# Patient Record
Sex: Male | Born: 1935 | Race: White | Hispanic: No | Marital: Married | Smoking: Never smoker
Health system: Southern US, Community
[De-identification: ages and names within clinical notes are randomized; demographics above are authoritative.]

## PROBLEM LIST (undated history)

## (undated) DIAGNOSIS — G039 Meningitis, unspecified: Secondary | ICD-10-CM

## (undated) DIAGNOSIS — N189 Chronic kidney disease, unspecified: Secondary | ICD-10-CM

## (undated) DIAGNOSIS — E210 Primary hyperparathyroidism: Secondary | ICD-10-CM

## (undated) DIAGNOSIS — E119 Type 2 diabetes mellitus without complications: Secondary | ICD-10-CM

## (undated) DIAGNOSIS — K861 Other chronic pancreatitis: Secondary | ICD-10-CM

## (undated) DIAGNOSIS — I10 Essential (primary) hypertension: Secondary | ICD-10-CM

## (undated) DIAGNOSIS — F0280 Dementia in other diseases classified elsewhere without behavioral disturbance: Secondary | ICD-10-CM

## (undated) DIAGNOSIS — I619 Nontraumatic intracerebral hemorrhage, unspecified: Secondary | ICD-10-CM

## (undated) DIAGNOSIS — N200 Calculus of kidney: Secondary | ICD-10-CM

## (undated) DIAGNOSIS — B351 Tinea unguium: Secondary | ICD-10-CM

## (undated) DIAGNOSIS — E781 Pure hyperglyceridemia: Secondary | ICD-10-CM

## (undated) DIAGNOSIS — I358 Other nonrheumatic aortic valve disorders: Secondary | ICD-10-CM

## (undated) DIAGNOSIS — M1990 Unspecified osteoarthritis, unspecified site: Secondary | ICD-10-CM

## (undated) DIAGNOSIS — D649 Anemia, unspecified: Secondary | ICD-10-CM

## (undated) HISTORY — DX: Calculus of kidney: N20.0

## (undated) HISTORY — DX: Primary hyperparathyroidism: E21.0

## (undated) HISTORY — DX: Tinea unguium: B35.1

## (undated) HISTORY — PX: CARDIAC CATHETERIZATION: SHX172

## (undated) HISTORY — DX: Dementia in other diseases classified elsewhere, unspecified severity, without behavioral disturbance, psychotic disturbance, mood disturbance, and anxiety: F02.80

## (undated) HISTORY — DX: Pure hyperglyceridemia: E78.1

## (undated) HISTORY — DX: Essential (primary) hypertension: I10

## (undated) HISTORY — PX: BRAIN SURGERY: SHX531

## (undated) HISTORY — DX: Anemia, unspecified: D64.9

## (undated) HISTORY — DX: Meningitis, unspecified: G03.9

## (undated) HISTORY — DX: Nontraumatic intracerebral hemorrhage, unspecified: I61.9

## (undated) HISTORY — DX: Other nonrheumatic aortic valve disorders: I35.8

## (undated) HISTORY — DX: Chronic kidney disease, unspecified: N18.9

## (undated) HISTORY — PX: TONSILLECTOMY: SUR1361

---

## 2015-10-17 ENCOUNTER — Ambulatory Visit
Admission: EM | Admit: 2015-10-17 | Discharge: 2015-10-17 | Disposition: A | Discharge status: Home / Self Care | Payer: Medicare Other | Attending: Family Medicine | Admitting: Family Medicine

## 2015-10-17 ENCOUNTER — Encounter: Payer: Self-pay | Admitting: Emergency Medicine

## 2015-10-17 ENCOUNTER — Ambulatory Visit (INDEPENDENT_AMBULATORY_CARE_PROVIDER_SITE_OTHER): Payer: Medicare Other

## 2015-10-17 DIAGNOSIS — S7002XA Contusion of left hip, initial encounter: Secondary | ICD-10-CM

## 2015-10-17 DIAGNOSIS — M25552 Pain in left hip: Secondary | ICD-10-CM

## 2015-10-17 HISTORY — DX: Unspecified osteoarthritis, unspecified site: M19.90

## 2015-10-17 HISTORY — DX: Type 2 diabetes mellitus without complications: E11.9

## 2015-10-17 HISTORY — DX: Other chronic pancreatitis: K86.1

## 2015-10-17 NOTE — ED Provider Notes (Signed)
CSN: [id number]     Arrival date & time 10/17/15  1334 History   First MD Initiated Contact with Patient 10/17/15 1532     Chief Complaint  Patient presents with  . Leg Pain   (Consider location/radiation/quality/duration/timing/severity/associated sxs/prior Treatment) HPI Comments: 82 yo male fell 3 days ago at home and landed on concrete hitting his left hip. Has been having pain since then. Able to walk but painful when walking. Denies pain radiating, numbness/tingling, bowel/bladder problems. Denies hitting his head or loss of consciousness.   The history is provided by the patient.    Past Medical History  Diagnosis Date  . Diabetes mellitus without complication ([place])   . 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 Arthritis    Past Surgical History  Procedure Laterality Date  . Tonsillectomy     History reviewed. No pertinent family history. Social History  Substance Use Topics  . Smoking status: Never Smoker   . Smokeless tobacco: None  . Alcohol Use: No    Review of Systems  Allergies  Review of patient's allergies indicates no known allergies.  Home Medications   Prior to Admission medications   Medication Sig Start Date End Date Taking? Authorizing Provider  AMLODIPINE BESY-BENAZEPRIL HCL PO Take by mouth.   Yes Historical Provider, MD  GLIPIZIDE PO Take by mouth.   Yes Historical Provider, MD  insulin glargine (LANTUS) 100 UNIT/ML injection Inject into the skin at bedtime.   Yes Historical Provider, MD  insulin lispro (HUMALOG) 100 UNIT/ML injection Inject into the skin 3 (three) times daily before meals.   Yes Historical Provider, MD  tamsulosin (FLOMAX) 0.4 MG CAPS capsule Take 0.4 mg by mouth.   Yes Historical Provider, MD   Meds Ordered and Administered this Visit  Medications - No data to display  BP 155/67 mmHg  Pulse 57  Temp(Src) 97.5 F (36.4 C) (Oral)  Resp 18  Ht 5\' 4"  (1.626 m)  Wt 138 lb (62.596 kg)  BMI 23.68 kg/m2  SpO2 99% No data  found.   Physical Exam  Constitutional: He appears well-developed and well-nourished. No distress.  HENT:  Head: Normocephalic and atraumatic.  Musculoskeletal:       Left hip: He exhibits tenderness and bony tenderness. He exhibits normal range of motion, normal strength, no swelling, no crepitus, no deformity and no laceration.  Neurological: He is alert.  Skin: Skin is warm and dry. No rash noted. He is not diaphoretic.  Nursing note and vitals reviewed.   ED Course  Procedures (including critical care time)  Labs Review Labs Reviewed - No data to display  Imaging Review Dg Hip Unilat With Pelvis 2-3 Views Left  10/17/2015  CLINICAL DATA:  Left hip pain since fall 4 days ago. EXAM: DG HIP (WITH OR WITHOUT PELVIS) 2-3V LEFT COMPARISON:  None. FINDINGS: Osseous alignment is normal. Left femoral head well positioned relative to the acetabulum. No fracture line or displaced fracture fragment seen. Mild degenerative change noted within the slightly scoliotic lower lumbar spine. Heavy atherosclerotic calcifications seen within the soft tissues about the bilateral hips and within the upper thighs. Soft tissues about the pelvis and left hip are otherwise unremarkable. IMPRESSION: No acute findings.  No osseous fracture or dislocation seen. Electronically Signed   By: Franki Cabot M.D.   On: 10/17/2015 14:56     Visual Acuity Review  Right Eye Distance:   Left Eye Distance:   Bilateral Distance:    Right Eye Near:   Left  Eye Near:    Bilateral Near:         MDM   1. Contusion of left hip, initial encounter   2. Hip pain, left    1. x-ray result (negative left hip/pelvis) and diagnosis reviewed with patient and spouse 2. rx as per orders above; reviewed possible side effects, interactions, risks and benefits  3. Recommend supportive treatment with rest, gentle stretching, ice/heat; use of walker for support 4. Follow-up prn if symptoms worsen or don't improve    Norval Gable, MD 10/17/15 1657

## 2015-10-17 NOTE — ED Notes (Signed)
Pt reports fell on Friday and since then has had worsening left side pain described now as in left buttocks/left lower back per spouse pain has been in left hip and leg as well. Pt reports "pain has moved as odd as that sounds"

## 2016-01-16 ENCOUNTER — Other Ambulatory Visit: Payer: Self-pay | Admitting: Ophthalmology

## 2016-01-16 DIAGNOSIS — H5340 Unspecified visual field defects: Secondary | ICD-10-CM

## 2016-02-02 ENCOUNTER — Ambulatory Visit
Admission: RE | Admit: 2016-02-02 | Discharge: 2016-02-02 | Disposition: A | Discharge status: Home / Self Care | Payer: Medicare Other | Source: Ambulatory Visit | Attending: Ophthalmology | Admitting: Ophthalmology

## 2016-02-02 DIAGNOSIS — G939 Disorder of brain, unspecified: Secondary | ICD-10-CM | POA: Diagnosis not present

## 2016-02-02 DIAGNOSIS — H5340 Unspecified visual field defects: Secondary | ICD-10-CM | POA: Diagnosis not present

## 2016-02-02 DIAGNOSIS — G936 Cerebral edema: Secondary | ICD-10-CM | POA: Insufficient documentation

## 2016-02-02 DIAGNOSIS — K859 Acute pancreatitis without necrosis or infection, unspecified: Secondary | ICD-10-CM | POA: Insufficient documentation

## 2016-02-02 MED ORDER — GADOBENATE DIMEGLUMINE 529 MG/ML IV SOLN
15.0000 mL | Freq: Once | INTRAVENOUS | Status: AC | PRN
  Administered 2016-02-02: 13 mL via INTRAVENOUS

## 2017-12-23 ENCOUNTER — Ambulatory Visit: Payer: Medicare Other | Admitting: Podiatry

## 2020-04-28 ENCOUNTER — Other Ambulatory Visit: Payer: Self-pay | Admitting: Nephrology

## 2020-04-28 DIAGNOSIS — N184 Chronic kidney disease, stage 4 (severe): Secondary | ICD-10-CM

## 2020-04-28 DIAGNOSIS — E1122 Type 2 diabetes mellitus with diabetic chronic kidney disease: Secondary | ICD-10-CM

## 2020-05-04 ENCOUNTER — Ambulatory Visit: Payer: Medicare Other

## 2020-05-27 ENCOUNTER — Ambulatory Visit
Admission: RE | Admit: 2020-05-27 | Discharge: 2020-05-27 | Disposition: A | Discharge status: Home / Self Care | Payer: Medicare Other | Source: Ambulatory Visit | Attending: Nephrology | Admitting: Nephrology

## 2020-05-27 ENCOUNTER — Other Ambulatory Visit: Payer: Self-pay

## 2020-05-27 DIAGNOSIS — E1122 Type 2 diabetes mellitus with diabetic chronic kidney disease: Secondary | ICD-10-CM | POA: Diagnosis present

## 2020-05-27 DIAGNOSIS — N184 Chronic kidney disease, stage 4 (severe): Secondary | ICD-10-CM | POA: Diagnosis present

## 2020-06-23 ENCOUNTER — Other Ambulatory Visit: Payer: Self-pay

## 2020-06-23 ENCOUNTER — Inpatient Hospital Stay: Payer: Medicare Other | Attending: Oncology | Admitting: Oncology

## 2020-06-23 ENCOUNTER — Encounter: Payer: Self-pay | Admitting: Oncology

## 2020-06-23 ENCOUNTER — Inpatient Hospital Stay: Payer: Medicare Other

## 2020-06-23 DIAGNOSIS — D631 Anemia in chronic kidney disease: Secondary | ICD-10-CM | POA: Diagnosis not present

## 2020-06-23 DIAGNOSIS — E213 Hyperparathyroidism, unspecified: Secondary | ICD-10-CM | POA: Diagnosis not present

## 2020-06-23 DIAGNOSIS — R944 Abnormal results of kidney function studies: Secondary | ICD-10-CM | POA: Diagnosis not present

## 2020-06-23 DIAGNOSIS — N184 Chronic kidney disease, stage 4 (severe): Secondary | ICD-10-CM | POA: Insufficient documentation

## 2020-06-23 DIAGNOSIS — M1990 Unspecified osteoarthritis, unspecified site: Secondary | ICD-10-CM

## 2020-06-23 DIAGNOSIS — R5383 Other fatigue: Secondary | ICD-10-CM | POA: Diagnosis not present

## 2020-06-23 DIAGNOSIS — I129 Hypertensive chronic kidney disease with stage 1 through stage 4 chronic kidney disease, or unspecified chronic kidney disease: Secondary | ICD-10-CM | POA: Insufficient documentation

## 2020-06-23 DIAGNOSIS — Z79899 Other long term (current) drug therapy: Secondary | ICD-10-CM | POA: Insufficient documentation

## 2020-06-23 DIAGNOSIS — N189 Chronic kidney disease, unspecified: Secondary | ICD-10-CM

## 2020-06-23 DIAGNOSIS — E1122 Type 2 diabetes mellitus with diabetic chronic kidney disease: Secondary | ICD-10-CM

## 2020-06-23 LAB — COMPREHENSIVE METABOLIC PANEL
ALT: 20 U/L (ref 0–44)
AST: 24 U/L (ref 15–41)
Albumin: 3 g/dL — ABNORMAL LOW (ref 3.5–5.0)
Alkaline Phosphatase: 79 U/L (ref 38–126)
Anion gap: 6 (ref 5–15)
BUN: 44 mg/dL — ABNORMAL HIGH (ref 8–23)
CO2: 22 mmol/L (ref 22–32)
Calcium: 7.9 mg/dL — ABNORMAL LOW (ref 8.9–10.3)
Chloride: 108 mmol/L (ref 98–111)
Creatinine, Ser: 2.17 mg/dL — ABNORMAL HIGH (ref 0.61–1.24)
GFR calc non Af Amer: 26 mL/min — ABNORMAL LOW (ref >60)
Glucose, Bld: 295 mg/dL — ABNORMAL HIGH (ref 70–99)
Potassium: 4.6 mmol/L (ref 3.5–5.1)
Sodium: 136 mmol/L (ref 135–145)
Total Bilirubin: 0.6 mg/dL (ref 0.3–1.2)
Total Protein: 7.7 g/dL (ref 6.5–8.1)

## 2020-06-23 LAB — CBC WITH DIFFERENTIAL/PLATELET
Abs Immature Granulocytes: 0.04 10*3/uL (ref 0.00–0.07)
Basophils Absolute: 0.1 10*3/uL (ref 0.0–0.1)
Basophils Relative: 1 %
Eosinophils Absolute: 0.3 10*3/uL (ref 0.0–0.5)
Eosinophils Relative: 4 %
HCT: 28.8 % — ABNORMAL LOW (ref 39.0–52.0)
Hemoglobin: 9.5 g/dL — ABNORMAL LOW (ref 13.0–17.0)
Immature Granulocytes: 1 %
Lymphocytes Relative: 18 %
Lymphs Abs: 1.4 10*3/uL (ref 0.7–4.0)
MCH: 30.3 pg (ref 26.0–34.0)
MCHC: 33 g/dL (ref 30.0–36.0)
MCV: 91.7 fL (ref 80.0–100.0)
Monocytes Absolute: 0.6 10*3/uL (ref 0.1–1.0)
Monocytes Relative: 7 %
Neutro Abs: 5.6 10*3/uL (ref 1.7–7.7)
Neutrophils Relative %: 69 %
Platelets: 209 10*3/uL (ref 150–400)
RBC: 3.14 MIL/uL — ABNORMAL LOW (ref 4.22–5.81)
RDW: 13.4 % (ref 11.5–15.5)
WBC: 8 10*3/uL (ref 4.0–10.5)
nRBC: 0 % (ref 0.0–0.2)

## 2020-06-23 LAB — IRON AND TIBC
Iron: 48 ug/dL (ref 45–182)
Saturation Ratios: 22 % (ref 17.9–39.5)
TIBC: 221 ug/dL — ABNORMAL LOW (ref 250–450)
UIBC: 173 ug/dL

## 2020-06-23 LAB — RETICULOCYTES
Immature Retic Fract: 6.1 % (ref 2.3–15.9)
RBC.: 3.16 MIL/uL — ABNORMAL LOW (ref 4.22–5.81)
Retic Count, Absolute: 41.4 10*3/uL (ref 19.0–186.0)
Retic Ct Pct: 1.3 % (ref 0.4–3.1)

## 2020-06-23 LAB — FERRITIN: Ferritin: 286 ng/mL (ref 24–336)

## 2020-06-23 LAB — VITAMIN B12: Vitamin B-12: 442 pg/mL (ref 180–914)

## 2020-06-23 LAB — TSH: TSH: 1.924 u[IU]/mL (ref 0.350–4.500)

## 2020-06-23 NOTE — Progress Notes (Signed)
Patient wife stated he is weak and dizzy. No appetite.

## 2020-06-24 LAB — KAPPA/LAMBDA LIGHT CHAINS
Kappa free light chain: 328 mg/L — ABNORMAL HIGH (ref 3.3–19.4)
Kappa, lambda light chain ratio: 1.71 — ABNORMAL HIGH (ref 0.26–1.65)
Lambda free light chains: 192 mg/L — ABNORMAL HIGH (ref 5.7–26.3)

## 2020-06-27 ENCOUNTER — Encounter: Payer: Self-pay | Admitting: Oncology

## 2020-06-27 DIAGNOSIS — D631 Anemia in chronic kidney disease: Secondary | ICD-10-CM | POA: Insufficient documentation

## 2020-06-27 LAB — MULTIPLE MYELOMA PANEL, SERUM
Albumin SerPl Elph-Mcnc: 3 g/dL (ref 2.9–4.4)
Albumin/Glob SerPl: 0.8 (ref 0.7–1.7)
Alpha 1: 0.2 g/dL (ref 0.0–0.4)
Alpha2 Glob SerPl Elph-Mcnc: 0.8 g/dL (ref 0.4–1.0)
B-Globulin SerPl Elph-Mcnc: 1 g/dL (ref 0.7–1.3)
Gamma Glob SerPl Elph-Mcnc: 2.2 g/dL — ABNORMAL HIGH (ref 0.4–1.8)
Globulin, Total: 4.2 g/dL — ABNORMAL HIGH (ref 2.2–3.9)
IgA: 127 mg/dL (ref 61–437)
IgG (Immunoglobin G), Serum: 2731 mg/dL — ABNORMAL HIGH (ref 603–1613)
IgM (Immunoglobulin M), Srm: 28 mg/dL (ref 15–143)
Total Protein ELP: 7.2 g/dL (ref 6.0–8.5)

## 2020-06-27 NOTE — Progress Notes (Signed)
Hematology/Oncology Consult note [facility] [phone] [phone]  Patient Care Team: Ashley Jacobs, MD as PCP - General (Family Medicine)   Name of the patient: Mitchell Stark  [id number]  January 02, 1929    Reason for referral-anemia of chronic kidney disease   Referring physician-Dr. Holley Raring  Date of visit: 06/27/20   History of presenting illness- Patient is a 86 year old male with a past medical history significant for stage IV CKD, type 2 diabetes, hypertension and hyperparathyroidism referred for anemia.  Most recent H&H from 06/17/2020 was 9.3/29.  Iron study showed a ferritin of 226 and iron saturation of 18%.  Creatinine was elevated at 2.1.  TSH was normal in September 2021.  Patient currently reports ongoing fatigue.  Appetite has been poor.  Denies any blood loss in his stool or urine.  Denies any dark melanotic stools  ECOG PS- 1  Pain scale- 0   Review of systems- Review of Systems  Constitutional: Positive for malaise/fatigue. Negative for chills, fever and weight loss.       Lack of appetite  HENT: Negative for congestion, ear discharge and nosebleeds.   Eyes: Negative for blurred vision.  Respiratory: Negative for cough, hemoptysis, sputum production, shortness of breath and wheezing.   Cardiovascular: Negative for chest pain, palpitations, orthopnea and claudication.  Gastrointestinal: Negative for abdominal pain, blood in stool, constipation, diarrhea, heartburn, melena, nausea and vomiting.  Genitourinary: Negative for dysuria, flank pain, frequency, hematuria and urgency.  Musculoskeletal: Negative for back pain, joint pain and myalgias.  Skin: Negative for rash.  Neurological: Negative for dizziness, tingling, focal weakness, seizures, weakness and headaches.  Endo/Heme/Allergies: Does not bruise/bleed easily.  Psychiatric/Behavioral: Negative for depression and suicidal ideas. The patient does not have insomnia.      No Known Allergies  There are no problems to display for this patient.    Past Medical History:  Diagnosis Date  . Arthritis   . 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 Diabetes mellitus without complication ([place])   . Hypertension      Past Surgical History:  Procedure Laterality Date  . TONSILLECTOMY      Social History   Socioeconomic History  . Marital status: Married    Spouse name: Not on file  . Number of children: Not on file  . Years of education: Not on file  . Highest education level: Not on file  Occupational History  . Not on file  Tobacco Use  . Smoking status: Never Smoker  . Smokeless tobacco: Never Used  Substance and Sexual Activity  . Alcohol use: No  . Drug use: Not on file  . Sexual activity: Not on file  Other Topics Concern  . Not on file  Social History Narrative  . Not on file   Social Determinants of Health   Financial Resource Strain:   . Difficulty of Paying Living Expenses: Not on file  Food Insecurity:   . Worried About Charity fundraiser in the Last Year: Not on file  . Ran Out of Food in the Last Year: Not on file  Transportation Needs:   . Lack of Transportation (Medical): Not on file  . Lack of Transportation (Non-Medical): Not on file  Physical Activity:   . Days of Exercise per Week: Not on file  . Minutes of Exercise per Session: Not on file  Stress:   . Feeling of Stress : Not on file  Social Connections:   . Frequency of Communication with Friends  and Family: Not on file  . Frequency of Social Gatherings with Friends and Family: Not on file  . Attends Religious Services: Not on file  . Active Member of Clubs or Organizations: Not on file  . Attends Archivist Meetings: Not on file  . Marital Status: Not on file  Intimate Partner Violence:   . Fear of Current or Ex-Partner: Not on file  . Emotionally Abused: Not on file  . Physically Abused: Not on file  . Sexually Abused: Not on file      History reviewed. No pertinent family history.   Current Outpatient Medications:  .  finasteride (PROSCAR) 5 MG tablet, Take 5 mg by mouth daily., Disp: , Rfl:  .  furosemide (LASIX) 40 MG tablet, Take one tab daily for three days, and then take one tab daily as needed for swelling thereafter. Eat potassium rich foods with this medication., Disp: , Rfl:  .  gabapentin (NEURONTIN) 100 MG capsule, Take 100 mg by mouth 2 (two) times daily., Disp: , Rfl:  .  GLIPIZIDE PO, Take by mouth., Disp: , Rfl:  .  hydrocortisone 2.5 % cream, Apply topically 2 (two) times daily., Disp: , Rfl:  .  insulin glargine (LANTUS) 100 UNIT/ML injection, Inject into the skin at bedtime., Disp: , Rfl:  .  insulin lispro (HUMALOG) 100 UNIT/ML injection, Inject into the skin 3 (three) times daily before meals., Disp: , Rfl:  .  ketoconazole (NIZORAL) 2 % cream, Apply topically 2 (two) times daily., Disp: , Rfl:  .  Lancets 28G MISC, , Disp: , Rfl:  .  levETIRAcetam (KEPPRA) 250 MG tablet, Take 250 mg by mouth 2 (two) times daily., Disp: , Rfl:  .  losartan (COZAAR) 25 MG tablet, Take by mouth., Disp: , Rfl:  .  memantine (NAMENDA) 10 MG tablet, Take 10 mg by mouth 2 (two) times daily., Disp: , Rfl:  .  metoprolol succinate (TOPROL-XL) 25 MG 24 hr tablet, Take by mouth., Disp: , Rfl:  .  Multiple Vitamin (MULTIVITAMIN) capsule, Take 1 capsule by mouth daily., Disp: , Rfl:  .  nitroGLYCERIN (NITROSTAT) 0.4 MG SL tablet, Place under the tongue., Disp: , Rfl:  .  pantoprazole (PROTONIX) 40 MG tablet, Take by mouth., Disp: , Rfl:  .  sodium fluoride (FLUORISHIELD) 1.1 % GEL dental gel, BRUSH EVERY DAY AT BEDTIME, Disp: , Rfl:  .  tamsulosin (FLOMAX) 0.4 MG CAPS capsule, Take 0.4 mg by mouth., Disp: , Rfl:  .  ZENPEP 40000-126000 units CPEP, Take by mouth., Disp: , Rfl:  .  AMLODIPINE BESY-BENAZEPRIL HCL PO, Take by mouth. (Patient not taking: Reported on 06/23/2020), Disp: , Rfl:    Physical exam: There were no vitals  filed for this visit. Physical Exam Constitutional:      Comments: Appears fatigued  Cardiovascular:     Rate and Rhythm: Normal rate and regular rhythm.     Heart sounds: Normal heart sounds.  Pulmonary:     Effort: Pulmonary effort is normal.     Breath sounds: Normal breath sounds.  Abdominal:     General: Bowel sounds are normal.     Palpations: Abdomen is soft.  Skin:    General: Skin is warm and dry.  Neurological:     Mental Status: He is alert and oriented to person, place, and time.        CMP Latest Ref Rng & Units 06/23/2020  Glucose 70 - 99 mg/dL 295(H)  BUN 8 - 23  mg/dL 44(H)  Creatinine 0.61 - 1.24 mg/dL 2.17(H)  Sodium 135 - 145 mmol/L 136  Potassium 3.5 - 5.1 mmol/L 4.6  Chloride 98 - 111 mmol/L 108  CO2 22 - 32 mmol/L 22  Calcium 8.9 - 10.3 mg/dL 7.9(L)  Total Protein 6.5 - 8.1 g/dL 7.7  Total Bilirubin 0.3 - 1.2 mg/dL 0.6  Alkaline Phos 38 - 126 U/L 79  AST 15 - 41 U/L 24  ALT 0 - 44 U/L 20   CBC Latest Ref Rng & Units 06/23/2020  WBC 4.0 - 10.5 K/uL 8.0  Hemoglobin 13.0 - 17.0 g/dL 9.5(L)  Hematocrit 39 - 52 % 28.8(L)  Platelets 150 - 400 K/uL 209     Assessment and plan- Patient is a 86 y.o. male referred for anemia of chronic kidney disease referred for anemia  Will complete anemia work-up including CBC ferritin and iron studies B12 and folate TSH myeloma panel and serum free light chains.  If he does not have any conclusive evidence of iron deficiency given that he has CKD and not on dialysis giving him a trial of EPO would be reasonable.  Discussed risks and benefits of report including all but not limited to risk of thromboembolic side effects attacks and strokes when hemoglobin is increased beyond 12.  He understands that the target hemoglobin with the goal would be between 10-11.  Patient understands and agrees to proceed as planned   Thank you for this kind referral and the opportunity to participate in the care of this patient   Visit  Diagnosis 1. Anemia of chronic renal failure, unspecified CKD stage     Dr. Randa Evens, MD, MPH [facility] [phone] 06/27/2020 4:39 PM

## 2020-06-28 ENCOUNTER — Telehealth: Payer: Self-pay | Admitting: *Deleted

## 2020-06-28 ENCOUNTER — Other Ambulatory Visit: Payer: Self-pay | Admitting: *Deleted

## 2020-06-28 DIAGNOSIS — D631 Anemia in chronic kidney disease: Secondary | ICD-10-CM

## 2020-06-28 DIAGNOSIS — N189 Chronic kidney disease, unspecified: Secondary | ICD-10-CM

## 2020-06-28 NOTE — Telephone Encounter (Signed)
Called wife and let her know that dr Janese Banks said that he is not iron deficient but the hgb lower due to his chronic kidney disease. She would like him to start injection this week. appt made for 10/14 at 1:45. When they get here I have left a envelope with all his appts for the future. Wife is agreeable.

## 2020-06-30 ENCOUNTER — Inpatient Hospital Stay: Payer: Medicare Other

## 2020-06-30 ENCOUNTER — Other Ambulatory Visit: Payer: Medicare Other

## 2020-06-30 ENCOUNTER — Other Ambulatory Visit: Payer: Self-pay

## 2020-06-30 VITALS — BP 149/69

## 2020-06-30 DIAGNOSIS — D631 Anemia in chronic kidney disease: Secondary | ICD-10-CM

## 2020-06-30 DIAGNOSIS — I129 Hypertensive chronic kidney disease with stage 1 through stage 4 chronic kidney disease, or unspecified chronic kidney disease: Secondary | ICD-10-CM | POA: Diagnosis not present

## 2020-06-30 MED ORDER — EPOETIN ALFA-EPBX 40000 UNIT/ML IJ SOLN
40000.0000 [IU] | INTRAMUSCULAR | Status: AC
  Administered 2020-06-30: 40000 [IU] via SUBCUTANEOUS
  Filled 2020-06-30: qty 1

## 2020-07-19 ENCOUNTER — Other Ambulatory Visit: Payer: Medicare Other

## 2020-07-19 ENCOUNTER — Ambulatory Visit: Payer: Medicare Other

## 2020-07-21 ENCOUNTER — Inpatient Hospital Stay: Payer: Medicare Other

## 2020-08-09 ENCOUNTER — Other Ambulatory Visit: Payer: Medicare Other

## 2020-08-09 ENCOUNTER — Ambulatory Visit: Payer: Medicare Other

## 2020-08-17 DEATH — deceased

## 2020-08-30 ENCOUNTER — Other Ambulatory Visit: Payer: Medicare Other

## 2020-08-30 ENCOUNTER — Ambulatory Visit: Payer: Medicare Other

## 2020-09-01 ENCOUNTER — Inpatient Hospital Stay: Payer: Medicare Other

## 2020-09-01 ENCOUNTER — Inpatient Hospital Stay: Payer: Medicare Other | Attending: Oncology

## 2020-09-20 ENCOUNTER — Other Ambulatory Visit: Payer: Medicare Other

## 2020-09-20 ENCOUNTER — Ambulatory Visit: Payer: Medicare Other | Admitting: Oncology

## 2020-09-22 ENCOUNTER — Ambulatory Visit: Payer: Medicare Other

## 2020-09-22 ENCOUNTER — Other Ambulatory Visit: Payer: Medicare Other

## 2020-09-22 ENCOUNTER — Ambulatory Visit: Payer: Medicare Other | Admitting: Oncology

## 2022-01-11 IMAGING — US US RENAL
1 series · 14 of 25 positions shown · non-contrast
Comparison: None.

CLINICAL DATA: Chronic kidney disease.

EXAM:
RENAL / URINARY TRACT ULTRASOUND COMPLETE

[Series 1: us renal · 0.23mm/px · 14 of 37 slices shown]
[im 1/37]
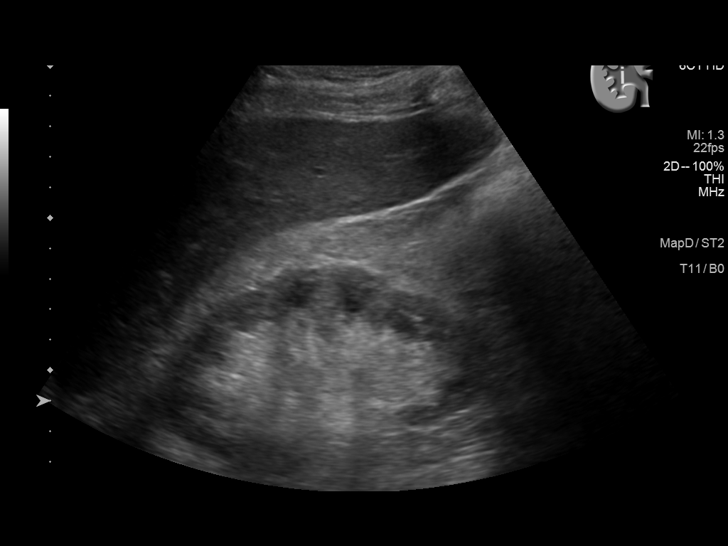
[im 4/37]
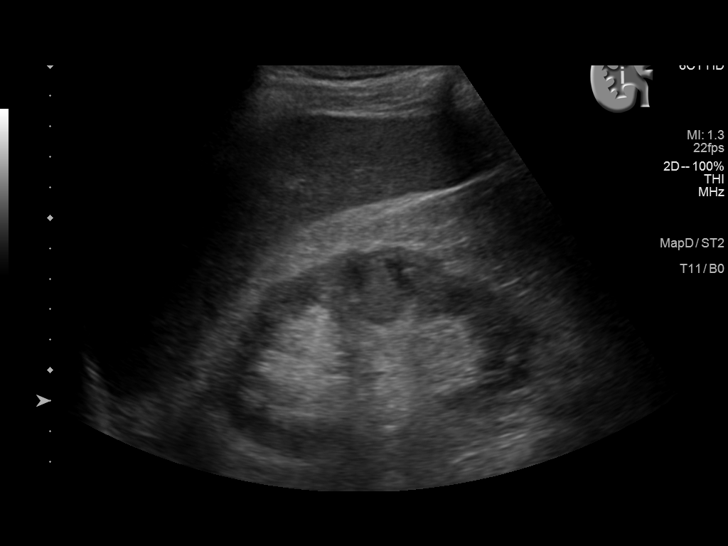
[im 7/37]
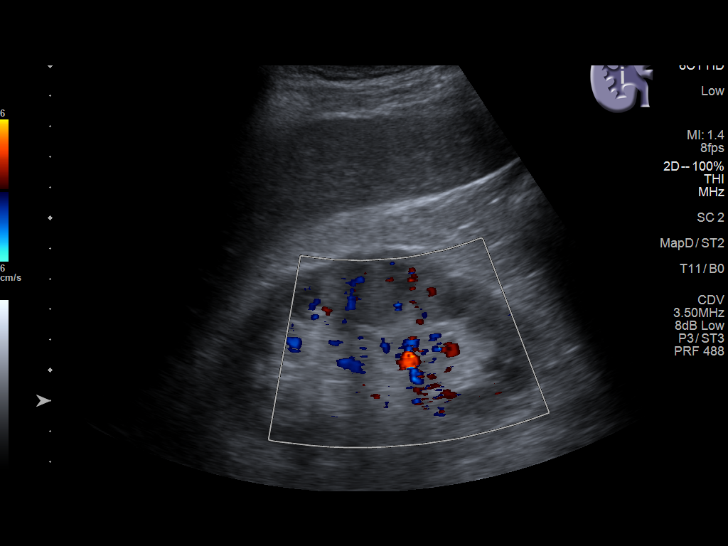
[im 10/37]
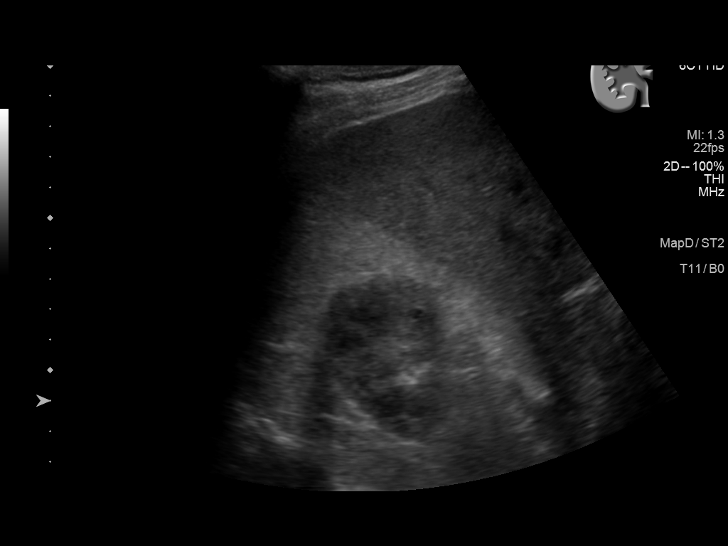
[im 13/37]
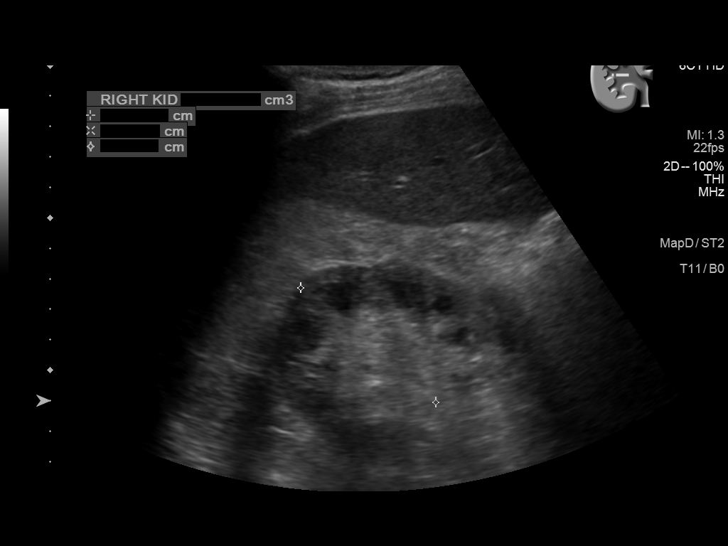
[im 14/37]
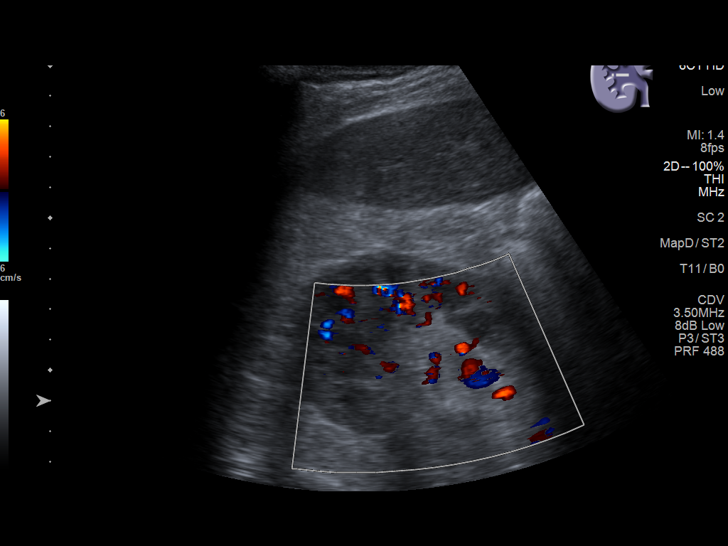
[im 17/37]
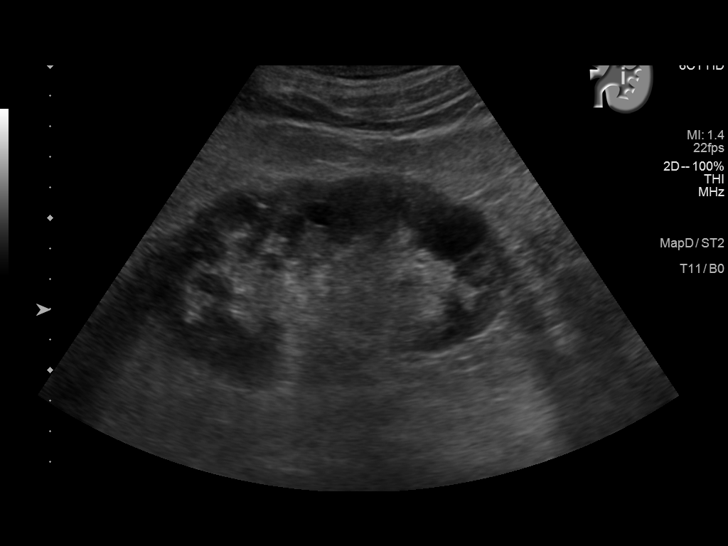
[im 20/37]
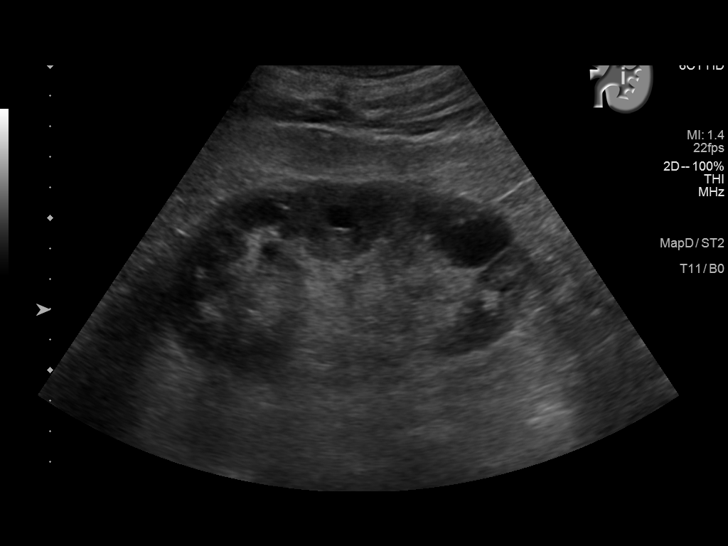
[im 23/37]
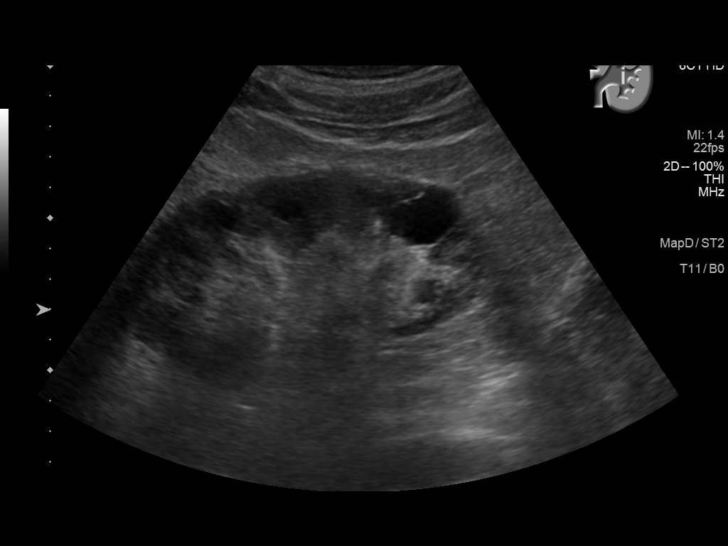
[im 25/37]
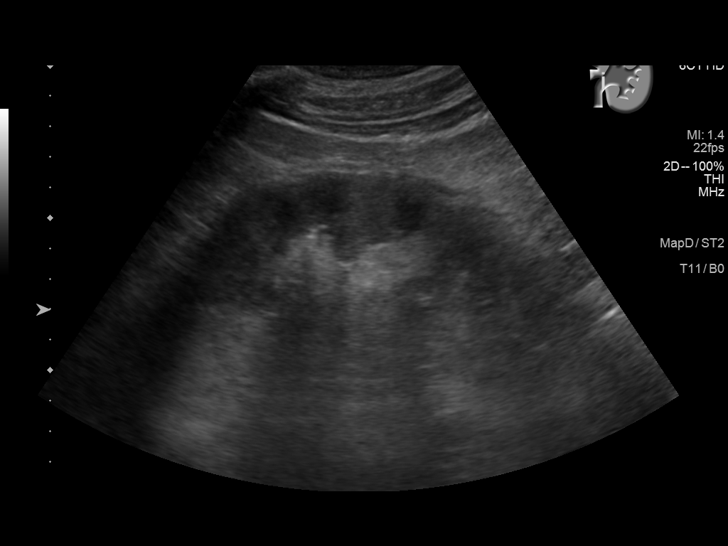
[im 28/37]
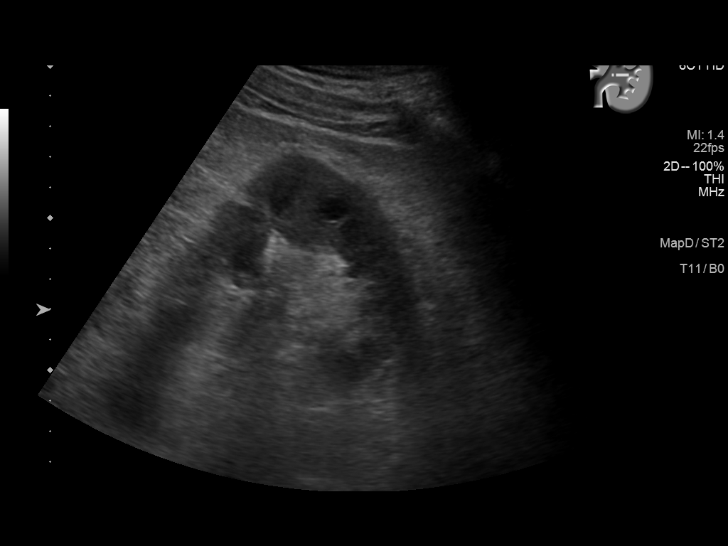
[im 31/37]
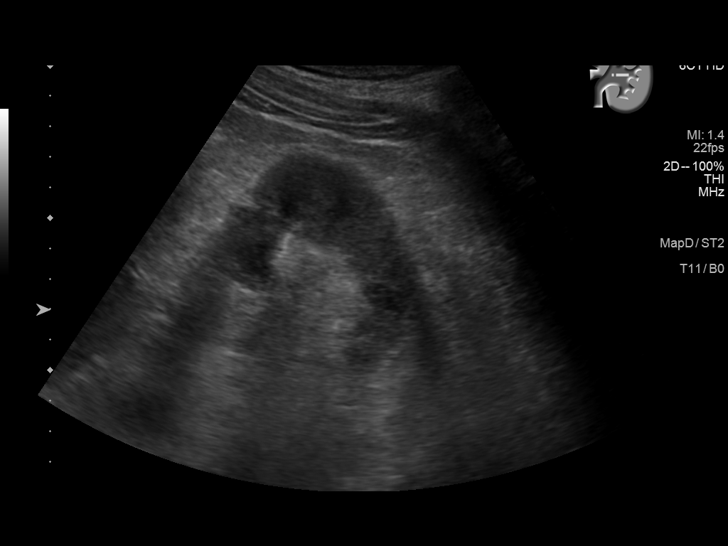
[im 34/37]
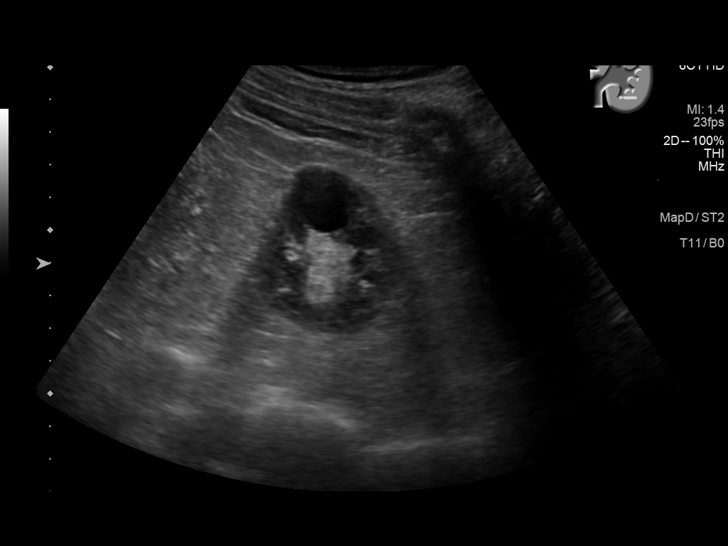
[im 37/37]
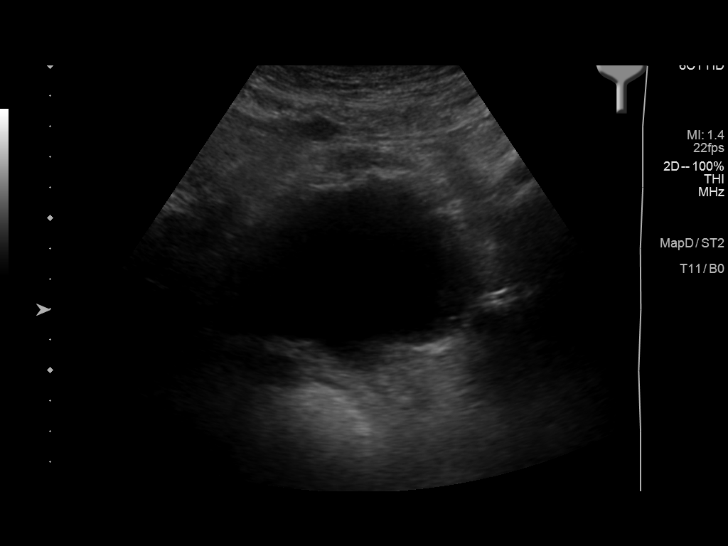

[14 of 25 positions shown; findings below may reference images not displayed]

FINDINGS: Right Kidney:

Renal measurements: 11 x 6.4 x 5.8 cm = volume: 212.5 mL.
Echogenicity within normal limits. No mass or hydronephrosis
visualized.

Left Kidney:

Renal measurements: 11.9 x 6.5 x 5.2 cm = volume: 207.5 mL.
Echogenicity within normal limits. No solid mass or hydronephrosis
visualized. 2.3 x 1.5 x 1.6 cm anechoic mass in the inferior pole
most consistent with a cyst. Two smaller subcentimeter anechoic
masses in the upper and midpole.

Bladder:

Appears normal for degree of bladder distention.

Other:

None.
IMPRESSION: 1.  No obstructive uropathy.

2.  Small left renal cyst.
